# Patient Record
Sex: Male | Born: 1980 | Race: White | Hispanic: No | Marital: Single | Smoking: Current every day smoker
Health system: Southern US, Community
[De-identification: ages and names within clinical notes are randomized; demographics above are authoritative.]

## PROBLEM LIST (undated history)

## (undated) HISTORY — PX: FRACTURE SURGERY: SHX138

---
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
## 2000-09-14 ENCOUNTER — Encounter: Admission: RE | Admit: 2000-09-14 | Discharge: 2000-09-14 | Payer: Self-pay | Admitting: Family Medicine

## 2002-07-26 ENCOUNTER — Emergency Department (HOSPITAL_COMMUNITY): Admission: EM | Admit: 2002-07-26 | Discharge: 2002-07-26 | Payer: Self-pay | Admitting: Emergency Medicine

## 2002-07-26 ENCOUNTER — Encounter: Payer: Self-pay | Admitting: Emergency Medicine

## 2003-10-12 ENCOUNTER — Emergency Department (HOSPITAL_COMMUNITY): Admission: EM | Admit: 2003-10-12 | Discharge: 2003-10-12 | Payer: Self-pay | Admitting: Emergency Medicine

## 2005-03-03 ENCOUNTER — Emergency Department (HOSPITAL_COMMUNITY): Admission: EM | Admit: 2005-03-03 | Discharge: 2005-03-03 | Payer: Self-pay | Admitting: Emergency Medicine

## 2006-07-19 ENCOUNTER — Emergency Department (HOSPITAL_COMMUNITY): Admission: EM | Admit: 2006-07-19 | Discharge: 2006-07-19 | Payer: Self-pay | Admitting: Emergency Medicine

## 2017-05-16 ENCOUNTER — Encounter (HOSPITAL_COMMUNITY): Payer: Self-pay | Admitting: Emergency Medicine

## 2017-05-16 ENCOUNTER — Emergency Department (HOSPITAL_COMMUNITY)
Admission: EM | Admit: 2017-05-16 | Discharge: 2017-05-16 | Discharge status: Against Medical Advice | Payer: Self-pay | Attending: Emergency Medicine | Admitting: Emergency Medicine

## 2017-05-16 DIAGNOSIS — Y939 Activity, unspecified: Secondary | ICD-10-CM | POA: Insufficient documentation

## 2017-05-16 DIAGNOSIS — Y998 Other external cause status: Secondary | ICD-10-CM | POA: Insufficient documentation

## 2017-05-16 DIAGNOSIS — W260XXA Contact with knife, initial encounter: Secondary | ICD-10-CM | POA: Insufficient documentation

## 2017-05-16 DIAGNOSIS — Y929 Unspecified place or not applicable: Secondary | ICD-10-CM | POA: Insufficient documentation

## 2017-05-16 DIAGNOSIS — S0101XA Laceration without foreign body of scalp, initial encounter: Secondary | ICD-10-CM | POA: Insufficient documentation

## 2017-05-16 DIAGNOSIS — F17210 Nicotine dependence, cigarettes, uncomplicated: Secondary | ICD-10-CM | POA: Insufficient documentation

## 2017-05-16 LAB — PREPARE FRESH FROZEN PLASMA
Unit division: 0
Unit division: 0

## 2017-05-16 LAB — BPAM RBC
BLOOD PRODUCT EXPIRATION DATE: 201905222359
BLOOD PRODUCT EXPIRATION DATE: 201905222359
ISSUE DATE / TIME: 201905150142
ISSUE DATE / TIME: 201905150142
UNIT TYPE AND RH: 9500
Unit Type and Rh: 9500

## 2017-05-16 LAB — BPAM FFP
Blood Product Expiration Date: 201905252359
Blood Product Expiration Date: 201906022359
ISSUE DATE / TIME: 201905150143
ISSUE DATE / TIME: 201905150143
Unit Type and Rh: 6200
Unit Type and Rh: 6200

## 2017-05-16 LAB — I-STAT CHEM 8, ED
BUN: 9 mg/dL (ref 6–20)
Calcium, Ion: 1.04 mmol/L — ABNORMAL LOW (ref 1.15–1.40)
Chloride: 106 mmol/L (ref 101–111)
Creatinine, Ser: 1.1 mg/dL (ref 0.61–1.24)
Glucose, Bld: 90 mg/dL (ref 65–99)
HEMATOCRIT: 48 % (ref 39.0–52.0)
Hemoglobin: 16.3 g/dL (ref 13.0–17.0)
Potassium: 3.8 mmol/L (ref 3.5–5.1)
SODIUM: 142 mmol/L (ref 135–145)
TCO2: 22 mmol/L (ref 22–32)
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
## 2017-05-16 NOTE — ED Notes (Signed)
Patient pulled his peripheral IV angiocath /pulled monitor leads and walked out of the room despite multiple attempts by nurse explain and encourage him to stay . EDP notified that pt. eloped.

## 2017-05-16 NOTE — ED Notes (Signed)
Pt seen walking out of EMS door, stating that he was not staying all night.
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
## 2018-12-01 ENCOUNTER — Emergency Department (HOSPITAL_COMMUNITY)
Admission: EM | Admit: 2018-12-01 | Discharge: 2018-12-02 | Disposition: A | Discharge status: Home / Self Care | Payer: Self-pay | Attending: Emergency Medicine | Admitting: Emergency Medicine

## 2018-12-01 ENCOUNTER — Encounter (HOSPITAL_COMMUNITY): Payer: Self-pay | Admitting: Emergency Medicine

## 2018-12-01 ENCOUNTER — Other Ambulatory Visit: Payer: Self-pay

## 2018-12-01 DIAGNOSIS — S61012A Laceration without foreign body of left thumb without damage to nail, initial encounter: Secondary | ICD-10-CM | POA: Insufficient documentation

## 2018-12-01 DIAGNOSIS — Y9389 Activity, other specified: Secondary | ICD-10-CM | POA: Insufficient documentation

## 2018-12-01 DIAGNOSIS — W268XXA Contact with other sharp object(s), not elsewhere classified, initial encounter: Secondary | ICD-10-CM | POA: Insufficient documentation

## 2018-12-01 DIAGNOSIS — Y929 Unspecified place or not applicable: Secondary | ICD-10-CM | POA: Insufficient documentation

## 2018-12-01 DIAGNOSIS — F17200 Nicotine dependence, unspecified, uncomplicated: Secondary | ICD-10-CM | POA: Insufficient documentation

## 2018-12-01 DIAGNOSIS — Y999 Unspecified external cause status: Secondary | ICD-10-CM | POA: Insufficient documentation

## 2018-12-01 MED ORDER — LIDOCAINE HCL (PF) 1 % IJ SOLN
5.0000 mL | Freq: Once | INTRAMUSCULAR | Status: AC
  Administered 2018-12-01: 5 mL
  Filled 2018-12-01: qty 5
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
## 2018-12-01 NOTE — ED Notes (Signed)
Pt ambulated back inside the ED, picked up his BP cuff, and stated "I'm still here!". This RN asked pt if he left, pt states "what is she talking about I never went anywhere".

## 2018-12-02 MED ORDER — IBUPROFEN 800 MG PO TABS
800.0000 mg | ORAL_TABLET | Freq: Once | ORAL | Status: AC
  Administered 2018-12-02: 01:00:00 800 mg via ORAL
  Filled 2018-12-02: qty 1

## 2018-12-02 MED ORDER — CEPHALEXIN 500 MG PO CAPS: ORAL_CAPSULE | ORAL | 0 refills | Status: DC

## 2018-12-02 NOTE — Discharge Instructions (Addendum)
1. Medications: Tylenol or ibuprofen for pain, usual home medications °2. Treatment: ice for swelling, keep wound clean with warm soap and water and keep bandage dry, do not submerge in water for 24 hours °3. Follow Up: Please return in 7-10 days to have your stitches/staples removed or sooner if you have concerns. Return to the emergency department for increased redness, drainage of pus from the wound ° ° °WOUND CARE °• Keep area clean and dry for 24 hours. Do not remove bandage, if applied. °• After 24 hours, remove bandage and wash wound gently with mild soap and warm water. Reapply a new bandage after cleaning wound, if directed.  °• Continue daily cleansing with soap and water until stitches/staples are removed. °• Do not apply any ointments or creams to the wound while stitches/staples are in place, as this may cause delayed healing. °•Return if you experience any of the following signs of infection: Swelling, redness, pus drainage, streaking, fever >101.0 F °• Return if you experience excessive bleeding that does not stop after 15-20 minutes of constant, firm pressure. ° °

## 2020-02-15 ENCOUNTER — Emergency Department (HOSPITAL_COMMUNITY)
Admission: EM | Admit: 2020-02-15 | Discharge: 2020-02-15 | Disposition: A | Discharge status: Home / Self Care | Payer: No Typology Code available for payment source | Attending: Emergency Medicine | Admitting: Emergency Medicine

## 2020-02-15 ENCOUNTER — Emergency Department (HOSPITAL_COMMUNITY): Payer: No Typology Code available for payment source

## 2020-02-15 ENCOUNTER — Other Ambulatory Visit: Payer: Self-pay

## 2020-02-15 ENCOUNTER — Encounter (HOSPITAL_COMMUNITY): Payer: Self-pay

## 2020-02-15 DIAGNOSIS — F17200 Nicotine dependence, unspecified, uncomplicated: Secondary | ICD-10-CM | POA: Diagnosis not present

## 2020-02-15 DIAGNOSIS — R109 Unspecified abdominal pain: Secondary | ICD-10-CM | POA: Insufficient documentation

## 2020-02-15 DIAGNOSIS — S2241XA Multiple fractures of ribs, right side, initial encounter for closed fracture: Secondary | ICD-10-CM | POA: Insufficient documentation

## 2020-02-15 DIAGNOSIS — Y92410 Unspecified street and highway as the place of occurrence of the external cause: Secondary | ICD-10-CM | POA: Insufficient documentation

## 2020-02-15 DIAGNOSIS — S60221A Contusion of right hand, initial encounter: Secondary | ICD-10-CM | POA: Diagnosis not present

## 2020-02-15 DIAGNOSIS — S60511A Abrasion of right hand, initial encounter: Secondary | ICD-10-CM | POA: Diagnosis not present

## 2020-02-15 DIAGNOSIS — S299XXA Unspecified injury of thorax, initial encounter: Secondary | ICD-10-CM | POA: Diagnosis present

## 2020-02-15 MED ORDER — NAPROXEN 500 MG PO TABS: 500.0000 mg | ORAL_TABLET | Freq: Two times a day (BID) | ORAL | 0 refills | Status: DC

## 2020-02-15 MED ORDER — IOHEXOL 300 MG/ML  SOLN
100.0000 mL | Freq: Once | INTRAMUSCULAR | Status: AC | PRN
  Administered 2020-02-15: 100 mL via INTRAVENOUS

## 2020-02-15 MED ORDER — BACITRACIN ZINC 500 UNIT/GM EX OINT
1.0000 "application " | TOPICAL_OINTMENT | Freq: Two times a day (BID) | CUTANEOUS | Status: DC
  Administered 2020-02-15: 1 via TOPICAL
  Filled 2020-02-15: qty 0.9

## 2020-02-15 NOTE — Discharge Instructions (Signed)
Your x-rays show 3 small rib fractures on the right, there was no damage to your lungs or your abdomen, there is nothing else that is abnormal in your abdomen, no signs of cancer or hernias  Please take Naprosyn, 500mg  by mouth twice daily as needed for pain - this in an antiinflammatory medicine (NSAID) and is similar to ibuprofen - many people feel that it is stronger than ibuprofen and it is easier to take since it is a smaller pill.  Please use this only for 1 week - if your pain persists, you will need to follow up with your doctor in the office for ongoing guidance and pain control.
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
## 2020-02-15 NOTE — ED Notes (Addendum)
Niece and child at bedside.
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
## 2020-02-15 NOTE — ED Notes (Signed)
Pt's hand wrapped with antibiotic ointment and gauze

## 2020-02-25 ENCOUNTER — Ambulatory Visit (HOSPITAL_COMMUNITY)
Admission: EM | Admit: 2020-02-25 | Discharge: 2020-02-25 | Disposition: A | Discharge status: Home / Self Care | Payer: Self-pay | Attending: Family Medicine | Admitting: Family Medicine

## 2020-02-25 ENCOUNTER — Encounter (HOSPITAL_COMMUNITY): Payer: Self-pay | Admitting: Emergency Medicine

## 2020-02-25 ENCOUNTER — Other Ambulatory Visit: Payer: Self-pay

## 2020-02-25 DIAGNOSIS — S60221A Contusion of right hand, initial encounter: Secondary | ICD-10-CM

## 2020-02-25 DIAGNOSIS — M62830 Muscle spasm of back: Secondary | ICD-10-CM

## 2020-02-25 DIAGNOSIS — S2241XA Multiple fractures of ribs, right side, initial encounter for closed fracture: Secondary | ICD-10-CM

## 2020-02-25 MED ORDER — HYDROCODONE-ACETAMINOPHEN 5-325 MG PO TABS: 1.0000 | ORAL_TABLET | Freq: Four times a day (QID) | ORAL | 0 refills | Status: DC | PRN

## 2020-02-25 MED ORDER — CYCLOBENZAPRINE HCL 10 MG PO TABS: ORAL_TABLET | ORAL | 0 refills | Status: AC
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
## 2020-02-25 NOTE — Discharge Instructions (Addendum)
Be aware, you have been prescribed pain medications that may cause drowsiness. While taking this medication, do not take any other medications containing acetaminophen (Tylenol). Do not combine with alcohol or other illicit drugs. Please do not drive, operate heavy machinery, or take part in activities that require making important decisions while on this medication as your judgement may be clouded.  You may want to begin taking a stool softener such as Colace as the pain medicine can cause significant constipation.
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
## 2020-04-21 ENCOUNTER — Emergency Department (HOSPITAL_COMMUNITY)
Admission: EM | Admit: 2020-04-21 | Discharge: 2020-04-21 | Disposition: A | Discharge status: Home / Self Care | Payer: Self-pay | Attending: Emergency Medicine | Admitting: Emergency Medicine

## 2020-04-21 ENCOUNTER — Other Ambulatory Visit: Payer: Self-pay

## 2020-04-21 ENCOUNTER — Encounter (HOSPITAL_COMMUNITY): Payer: Self-pay | Admitting: Emergency Medicine

## 2020-04-21 DIAGNOSIS — F17200 Nicotine dependence, unspecified, uncomplicated: Secondary | ICD-10-CM | POA: Insufficient documentation

## 2020-04-21 DIAGNOSIS — L0291 Cutaneous abscess, unspecified: Secondary | ICD-10-CM

## 2020-04-21 DIAGNOSIS — L02412 Cutaneous abscess of left axilla: Secondary | ICD-10-CM | POA: Insufficient documentation

## 2020-04-21 MED ORDER — DOXYCYCLINE HYCLATE 100 MG PO CAPS: 100.0000 mg | ORAL_CAPSULE | Freq: Two times a day (BID) | ORAL | 0 refills | Status: AC

## 2020-04-21 NOTE — ED Provider Notes (Signed)
[facility]-EMERGENCY DEPT Provider Note   CSN: [id number] Arrival date & time: 04/21/20  1130     History No chief complaint on file.   Ian Bradley is a 40 y.o. male who presents for evaluation of pain, redness and swelling noted to the left axilla that has been ongoing for about a week.  He states initially, the area started off is 1 small bump.  He states that it started spreading and he had about 3 or 4 areas to the left axilla.  They have been draining at home.  He states that they are sore and painful.  He has not had any fever.  He reports he had a similar episode in his right axilla a few years ago that improved without any intervention.  He denies any IV drug use.  The history is provided by the patient.       History reviewed. No pertinent past medical history.  There are no problems to display for this patient.   Past Surgical History:  Procedure Laterality Date  . FRACTURE SURGERY         No family history on file.  Social History   Tobacco Use  . Smoking status: Current Every Day Smoker  . Smokeless tobacco: Never Used  Substance Use Topics  . Alcohol use: Yes  . Drug use: Never    Home Medications Prior to Admission medications   Medication Sig Start Date End Date Taking? Authorizing Provider  doxycycline (VIBRAMYCIN) 100 MG capsule Take 1 capsule (100 mg total) by mouth 2 (two) times daily for 7 days. 04/21/20 04/28/20 Yes Maxwell Caul, PA-C  cyclobenzaprine (FLEXERIL) 10 MG tablet Take 1 tablet by mouth 3 times daily as needed for muscle spasm. Warning: May cause drowsiness. 02/25/20   Mardella Layman, MD  HYDROcodone-acetaminophen (NORCO/VICODIN) 5-325 MG tablet Take 1 tablet by mouth every 6 (six) hours as needed for severe pain. 02/25/20   Mardella Layman, MD    Allergies    Patient has no known allergies.  Review of Systems   Review of Systems  Constitutional: Negative for fever.  Skin: Positive for color change and wound.   All other systems reviewed and are negative.   Physical Exam Updated Vital Signs BP 133/90 (BP Location: Right Arm)   Pulse 95   Temp 98.9 F (37.2 C) (Oral)   Resp 18   Ht 6\' 1"  (1.854 m)   Wt 68 kg   SpO2 100%   BMI 19.78 kg/m   Physical Exam Vitals and nursing note reviewed.  Constitutional:      Appearance: He is well-developed.  HENT:     Head: Normocephalic and atraumatic.  Eyes:     General: No scleral icterus.       Right eye: No discharge.        Left eye: No discharge.     Conjunctiva/sclera: Conjunctivae normal.  Pulmonary:     Effort: Pulmonary effort is normal.  Skin:    General: Skin is warm and dry.     Comments: He has areas of erythema, swelling noted to the left axilla.  There are some areas of scabbed over wounds that appear to be where they were draining.  He has 1 area about the size of a dime that is slightly fluctuant with a small opening towards the lateral aspect.  Not actively draining at this time.  Neurological:     Mental Status: He is alert.  Psychiatric:  Speech: Speech normal.        Behavior: Behavior normal.     ED Results / Procedures / Treatments   Labs (all labs ordered are listed, but only abnormal results are displayed) Labs Reviewed - No data to display  EKG None  Radiology No results found.  Procedures Procedures   Medications Ordered in ED Medications - No data to display  ED Course  I have reviewed the triage vital signs and the nursing notes.  Pertinent labs & imaging results that were available during my care of the patient were reviewed by me and considered in my medical decision making (see chart for details).    MDM Rules/Calculators/A&P                          40 year old male who presents for evaluation of pain, redness, swelling in the left axilla.  No fevers.  On initial arrival, he is afebrile nontoxic-appearing.  Vital signs are stable.  On exam, he has areas of erythema noted to the left  axilla.  He has about 3 spots that seem to be scabbed over wounds that appear to be where they were draining.  He has 1 area that is about the size of a dime that appears to be slightly fluctuant with a small or opening to his lateral aspect.  Not actively draining.  These are consistent with abscesses.  We discussed treatment options here in the ED.  I did discuss regarding I&D here in the ED versus antibiotics and warm compresses at home.  Patient would like to try antibiotics first.  Patient with no known drug allergies. At this time, patient exhibits no emergent life-threatening condition that require further evaluation in ED. Patient had ample opportunity for questions and discussion. All patient's questions were answered with full understanding. Strict return precautions discussed. Patient expresses understanding and agreement to plan.   Portions of this note were generated with Scientist, clinical (histocompatibility and immunogenetics). Dictation errors may occur despite best attempts at proofreading.   Final Clinical Impression(s) / ED Diagnoses Final diagnoses:  Abscess    Rx / DC Orders ED Discharge Orders         Ordered    doxycycline (VIBRAMYCIN) 100 MG capsule  2 times daily        04/21/20 1238           Rosana Hoes 04/21/20 1602    Cheryll Cockayne, MD 04/23/20 [phone]

## 2020-04-21 NOTE — ED Triage Notes (Signed)
Patient complains of L arm abscesses, states he has a hx of same, 4 sites noted red with minor drainage. States they are very painful, denies IV drug use.

## 2020-04-21 NOTE — Discharge Instructions (Signed)
Apply warm compresses to the area or soak the area in warm water to help continue express drainage.  ° °Keep the wound clean and dry. Gently wash the wound with soap and water and make sure to pat it dry.  ° °Take antibiotic and complete the entire course.  ° °You can take Tylenol or Ibuprofen as directed for pain. ° °Return to the Emergency Department if you experienced any worsening/spreading redness or swelling, fever, worsening pain, or any other worsening or concerning symptoms.  ° °

## 2021-07-28 ENCOUNTER — Emergency Department (HOSPITAL_COMMUNITY)
Admission: EM | Admit: 2021-07-28 | Discharge: 2021-07-28 | Disposition: A | Discharge status: Home / Self Care | Payer: Self-pay | Attending: Emergency Medicine | Admitting: Emergency Medicine

## 2021-07-28 ENCOUNTER — Other Ambulatory Visit: Payer: Self-pay

## 2021-07-28 ENCOUNTER — Encounter (HOSPITAL_COMMUNITY): Payer: Self-pay | Admitting: Emergency Medicine

## 2021-07-28 DIAGNOSIS — K047 Periapical abscess without sinus: Secondary | ICD-10-CM | POA: Insufficient documentation

## 2021-07-28 DIAGNOSIS — K029 Dental caries, unspecified: Secondary | ICD-10-CM | POA: Insufficient documentation

## 2021-07-28 MED ORDER — OXYCODONE-ACETAMINOPHEN 5-325 MG PO TABS
1.0000 | ORAL_TABLET | Freq: Once | ORAL | Status: AC
  Administered 2021-07-28: 1 via ORAL
  Filled 2021-07-28: qty 1

## 2021-07-28 MED ORDER — AMOXICILLIN-POT CLAVULANATE 875-125 MG PO TABS
1.0000 | ORAL_TABLET | Freq: Once | ORAL | Status: AC
  Administered 2021-07-28: 1 via ORAL
  Filled 2021-07-28: qty 1

## 2021-07-28 MED ORDER — AMOXICILLIN-POT CLAVULANATE 875-125 MG PO TABS: 1.0000 | ORAL_TABLET | Freq: Two times a day (BID) | ORAL | 0 refills | Status: AC

## 2021-07-28 MED ORDER — OXYCODONE-ACETAMINOPHEN 5-325 MG PO TABS: 1.0000 | ORAL_TABLET | Freq: Four times a day (QID) | ORAL | 0 refills | Status: AC | PRN

## 2021-07-28 NOTE — Discharge Instructions (Signed)
Please read and follow all provided instructions.  Your diagnoses today include:  1. Dental abscess    The exam and treatment you received today has been provided on an emergency basis only. This is not a substitute for complete medical or dental care.  Tests performed today include: Vital signs. See below for your results today.   Medications prescribed:  Augmentin - antibiotic  You have been prescribed an antibiotic medicine: take the entire course of medicine even if you are feeling better. Stopping early can cause the antibiotic not to work.  Percocet (oxycodone/acetaminophen) - narcotic pain medication  DO NOT drive or perform any activities that require you to be awake and alert because this medicine can make you drowsy. BE VERY CAREFUL not to take multiple medicines containing Tylenol (also called acetaminophen). Doing so can lead to an overdose which can damage your liver and cause liver failure and possibly death.  Take any prescribed medications only as directed.  Home care instructions:  Follow any educational materials contained in this packet.  Follow-up instructions: Please follow-up with your dentist for further evaluation of your symptoms.   Dental Assistance: See attached dental referral  Return instructions:  Please return to the Emergency Department if you experience worsening symptoms. Please return if you develop a fever, you develop more swelling in your face or neck, you have trouble breathing or swallowing food. Please return if you have any other emergent concerns.  Additional Information:  Your vital signs today were: BP (!) 137/92 (BP Location: Left Arm)   Pulse 96   Temp 99.3 F (37.4 C) (Oral)   Resp 16   SpO2 98%  If your blood pressure (BP) was elevated above 135/85 this visit, please have this repeated by your doctor within one month. --------------

## 2021-07-28 NOTE — ED Triage Notes (Signed)
Pt reports left lower dental pain x 3 days. States he has a dental abscess that is causing facial swelling and a headache.

## 2021-07-28 NOTE — ED Provider Notes (Signed)
[facility]-EMER[facility]PT Provider Note   CSN: [id number] Arrival date & time: 07/28/21  1036     History  Chief Complaint  Patient presents with   Dental Pain    Ian Bradley is a 41 y.o. male.  Patient presents to the emergency department today for evaluation of left-sided facial swelling.  Patient reports dental pain and facial swelling over the past 3 days.  No fevers at home.  No difficulty breathing or swallowing.  Swelling was worsening today.  Over-the-counter medications ineffective.  Patient does not have a dentist.  Associated ear pain and headache.       Home Medications Prior to Admission medications   Medication Sig Start Date End Date Taking? Authorizing Provider  amoxicillin-clavulanate (AUGMENTIN) 875-125 MG tablet Take 1 tablet by mouth every 12 (twelve) hours. 07/28/21  Yes Renne Crigler, PA-C  oxyCODONE-acetaminophen (PERCOCET/ROXICET) 5-325 MG tablet Take 1 tablet by mouth every 6 (six) hours as needed for severe pain. 07/28/21  Yes Renne Crigler, PA-C  cyclobenzaprine (FLEXERIL) 10 MG tablet Take 1 tablet by mouth 3 times daily as needed for muscle spasm. Warning: May cause drowsiness. 02/25/20   Mardella Layman, MD      Allergies    Patient has no known allergies.    Review of Systems   Review of Systems  Physical Exam Updated Vital Signs BP (!) 137/92 (BP Location: Left Arm)   Pulse 96   Temp 99.3 F (37.4 C) (Oral)   Resp 16   SpO2 98%  Physical Exam Vitals and nursing note reviewed.  Constitutional:      Appearance: He is well-developed.  HENT:     Head: Normocephalic and atraumatic.     Jaw: No trismus.     Right Ear: Tympanic membrane, ear canal and external ear normal.     Left Ear: Tympanic membrane, ear canal and external ear normal.     Nose: Nose normal.     Mouth/Throat:     Dentition: Abnormal dentition. Dental caries present. No dental abscesses.     Pharynx: Uvula midline. No uvula swelling.     Tonsils:  No tonsillar abscesses.     Comments: Patient with multiple dental caries.  Patient with soft tissue swelling over the left mandibular jaw without discrete abscess or induration.  There is some swelling induration without definitive abscess over the gumline.  No drainage. Eyes:     Pupils: Pupils are equal, round, and reactive to light.  Neck:     Comments: No neck swelling or Lugwig's angina Musculoskeletal:     Cervical back: Normal range of motion and neck supple.  Skin:    General: Skin is warm and dry.  Neurological:     Mental Status: He is alert.  Psychiatric:        Mood and Affect: Mood normal.     ED Results / Procedures / Treatments   Labs (all labs ordered are listed, but only abnormal results are displayed) Labs Reviewed - No data to display  EKG None  Radiology No results found.  Procedures Procedures    Medications Ordered in ED Medications  amoxicillin-clavulanate (AUGMENTIN) 875-125 MG per tablet 1 tablet (has no administration in time range)  oxyCODONE-acetaminophen (PERCOCET/ROXICET) 5-325 MG per tablet 1 tablet (has no administration in time range)    ED Course/ Medical Decision Making/ A&P    Patient seen and examined. History obtained directly from patient. Work-up including labs, imaging, EKG ordered in triage, if performed, were  reviewed.    Labs/EKG: None ordered  Imaging: None ordered  Medications/Fluids: Ordered: Augmentin, Percocet   Most recent vital signs reviewed and are as follows: BP (!) 137/92 (BP Location: Left Arm)   Pulse 96   Temp 99.3 F (37.4 C) (Oral)   Resp 16   SpO2 98%   Initial impression: Dental abscess, left mandibular  Home treatment plan: #6 Percocet, Augmentin  Return instructions discussed with patient: Patient counseled to return with worsening facial or neck swelling  Follow-up instructions discussed with patient: Follow-up with dental referral in 1 week.                           Medical Decision  Making Risk Prescription drug management.   Patient presents for dental pain. They do not have a fever and do not appear septic. Exam unconcerning for Ludwig's angina or other deep tissue infection in neck and I do not feel that advanced imaging is indicated at this time. Low suspicion for PTA, RPA, epiglottis based on exam.   Patient will be treated for dental infection with antibiotic. Encouraged tylenol/NSAIDs as prescribed or as directed on the packaging for pain. Encouraged follow-up with a dentist for definitive and long-term management.          Final Clinical Impression(s) / ED Diagnoses Final diagnoses:  Dental abscess    Rx / DC Orders ED Discharge Orders          Ordered    oxyCODONE-acetaminophen (PERCOCET/ROXICET) 5-325 MG tablet  Every 6 hours PRN        07/28/21 1214    amoxicillin-clavulanate (AUGMENTIN) 875-125 MG tablet  Every 12 hours        07/28/21 1214              Renne Crigler, PA-C 07/28/21 1219    Virgina Norfolk, DO 07/28/21 1307
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
